# Patient Record
Sex: Male | Born: 1945 | Race: White | Hispanic: Yes | Marital: Married | Smoking: Never smoker
Health system: Southern US, Community
[De-identification: ages and names within clinical notes are randomized; demographics above are authoritative.]

## PROBLEM LIST (undated history)

## (undated) DIAGNOSIS — H409 Unspecified glaucoma: Secondary | ICD-10-CM

## (undated) HISTORY — PX: HERNIA REPAIR: SHX51

## (undated) HISTORY — DX: Unspecified glaucoma: H40.9

## (undated) HISTORY — PX: EYE SURGERY: SHX253

---

## 2016-06-16 ENCOUNTER — Telehealth: Payer: Self-pay | Admitting: Family Medicine

## 2016-06-16 NOTE — Telephone Encounter (Signed)
Note    Pt wife cadence haslam is needing to talk with dr sagardia regarding her husband and his loss or memory and change in behavior she does not want the husband to know that this is being discussed right now he has an appointment with Dr Terence Lux tomorrow at 1:30 please call wife before appointment she is very upset and worried about husband she says that he will be upset because he is coming to Doctor office but liked Dr Alvy Bimler when his wife came in to see him please respond please ask for his wife Zipporah Plants number [phone]

## 2016-06-17 ENCOUNTER — Ambulatory Visit (INDEPENDENT_AMBULATORY_CARE_PROVIDER_SITE_OTHER): Payer: Medicare Other | Admitting: Emergency Medicine

## 2016-06-17 VITALS — BP 151/79 | HR 54 | Temp 98.9°F | Resp 16 | Wt 153.8 lb

## 2016-06-17 DIAGNOSIS — Z8669 Personal history of other diseases of the nervous system and sense organs: Secondary | ICD-10-CM

## 2016-06-17 DIAGNOSIS — I10 Essential (primary) hypertension: Secondary | ICD-10-CM | POA: Diagnosis not present

## 2016-06-17 DIAGNOSIS — Z1211 Encounter for screening for malignant neoplasm of colon: Secondary | ICD-10-CM | POA: Diagnosis not present

## 2016-06-17 DIAGNOSIS — R413 Other amnesia: Secondary | ICD-10-CM | POA: Diagnosis not present

## 2016-06-17 DIAGNOSIS — H9192 Unspecified hearing loss, left ear: Secondary | ICD-10-CM

## 2016-06-17 DIAGNOSIS — E785 Hyperlipidemia, unspecified: Secondary | ICD-10-CM | POA: Diagnosis not present

## 2016-06-17 MED ORDER — HYDROCHLOROTHIAZIDE 25 MG PO TABS: 25.0000 mg | ORAL_TABLET | Freq: Every day | ORAL | 3 refills | Status: DC

## 2016-06-17 MED ORDER — PRAVASTATIN SODIUM 40 MG PO TABS: 40.0000 mg | ORAL_TABLET | Freq: Every day | ORAL | 3 refills | Status: AC

## 2016-06-17 MED ORDER — ATENOLOL 50 MG PO TABS: 50.0000 mg | ORAL_TABLET | Freq: Every day | ORAL | 3 refills | Status: AC

## 2016-06-17 NOTE — Patient Instructions (Addendum)
IF you received an x-ray today, you will receive an invoice from [facility] Radiology. Please contact [facility] Radiology at [phone] with questions or concerns regarding your invoice.   IF you received labwork today, you will receive an invoice from [facility]. Please contact [facility] at [phone] with questions or concerns regarding your invoice.   Our billing staff will not be able to assist you with questions regarding bills from these companies.  You will be contacted with the lab results as soon as they are available. The fastest way to get your results is to activate your My Chart account. Instructions are located on the last page of this paperwork. If you have not heard from [place] regarding the results in 2 weeks, please contact this office.      Hipertensin Hypertension El trmino hipertensin es otra forma de denominar a la presin arterial elevada. La presin arterial elevada fuerza al corazn a trabajar ms para bombear la sangre. Esto puede causar problemas con el paso del [place]. Una lectura de presin arterial est compuesta por 2 nmeros. Hay un nmero superior (sistlico) sobre un nmero inferior (diastlico). Lo ideal es tener la presin arterial por debajo de 120/80. Las decisiones saludables pueden ayudarle a disminuir su presin arterial. Es posible que necesite medicamentos que le ayuden a disminuir su presin arterial si:  Su presin arterial no disminuye mediante decisiones saludables.  Su presin arterial est por encima de 130/80. Siga estas instrucciones en su casa: Comida y bebida   Si se lo indican, siga el plan de alimentacin de DASH (Dietary Approaches to Stop Hypertension, Maneras de alimentarse para detener la hipertensin). Esta dieta incluye:  Que la mitad del plato de cada comida sea de frutas y verduras.  Que un cuarto del plato de cada comida sea de cereales integrales. Los cereales integrales incluyen pasta integral, arroz integral y pan  integral.  Comer y beber productos lcteos con bajo contenido de [place], como leche descremada o yogur bajo en grasas.  Que un cuarto del plato de cada comida sea de protenas bajas en grasa [place]). Las protenas bajas en grasa incluyen pescado, pollo sin piel, huevos, frijoles y tofu.  Evitar consumir carne grasa, carne curada y procesada, o pollo con piel.  Evitar consumir alimentos prehechos o procesados.  Consuma menos de 1500 mg de sal (sodio) por da.  Limite el consumo de alcohol a no ms de 1 medida por da si es mujer y no est Orthoptist y a 2 medidas por da si es hombre. Una medida equivale a 12onzas de cerveza, 5onzas de vino o 1onzas de bebidas alcohlicas de alta graduacin. Estilo de vida   Trabaje con su mdico para mantenerse en un peso saludable o para perder peso. Pregntele a su mdico cul es el peso recomendable para usted.  Realice al menos 30 minutos de ejercicio que haga que se acelere su corazn (ejercicio Magazine features editor) la [employer] de la [place]. Estos pueden incluir caminar, nadar o andar en bicicleta.  Realice al menos 30 minutos de ejercicio que fortalezca sus msculos (ejercicios de resistencia) al menos 3 das a la [place]. Estos pueden incluir levantar pesas o hacer pilates.  No consuma ningn producto que contenga nicotina o tabaco. Esto incluye cigarrillos y cigarrillos electrnicos. Si necesita ayuda para dejar de fumar, consulte al mdico.  Controle su presin arterial en su casa tal como le indic el mdico.  Concurra a todas las visitas de control como se lo haya indicado el mdico.  Esto es importante. Medicamentos   [employer] de venta libre y los recetados solamente como se lo haya indicado el mdico. Siga cuidadosamente las indicaciones.  No omita las dosis de medicamentos para la presin arterial. Los medicamentos pierden eficacia si omite dosis. El hecho de omitir las dosis tambin [place] el riesgo de otros  problemas.  Pregntele a su mdico a qu efectos secundarios o reacciones a los Museum/gallery curator. Comunquese con un mdico si:  Piensa que tiene [place] reaccin a los medicamentos que est tomando.  Tiene dolores de cabeza frecuentes (recurrentes).  Siente mareos.  Tiene hinchazn en los tobillos.  Tiene problemas de visin. Solicite ayuda de inmediato si:  Siente un dolor de cabeza muy intenso.  Comienza a sentirse confundido.  Se siente dbil o adormecido.  Siente que va a desmayarse.  Siente un dolor muy intenso en:  El pecho.  El vientre (abdomen).  Devuelve (vomita) ms de una vez.  Tiene dificultad para respirar. Resumen  El trmino hipertensin es otra forma de denominar a la presin arterial elevada.  Las decisiones saludables pueden ayudarle a disminuir su presin arterial. Si no puede controlar su presin arterial mediante decisiones saludables, es posible que deba tomar medicamentos. Esta informacin no tiene Theme park manager el consejo del mdico. Asegrese de hacerle al mdico cualquier pregunta que tenga. Document Released: 08/05/2009 Document Revised: 01/28/2016 Document Reviewed: 01/28/2016 Elsevier Interactive Patient Education  2017 [employer].

## 2016-06-17 NOTE — Progress Notes (Signed)
Mark Jensen 71 y.o.   Chief Complaint  Patient presents with  . Establish Care    Memory issues     HISTORY OF PRESENT ILLNESS: This is a 71 y.o. 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  HPI   Prior to Admission medications   Medication Sig Start Date End Date Taking? Authorizing Provider  atenolol (TENORMIN) 50 MG tablet Take 50 mg by mouth daily.   Yes Historical Provider, MD  hydrALAZINE (APRESOLINE) 25 MG tablet Take 25 mg by mouth once.   Yes Historical Provider, MD  pravastatin (PRAVACHOL) 40 MG tablet Take 40 mg by mouth daily.   Yes Historical Provider, MD    Allergies  Allergen Reactions  . Aspirin   . Penicillins     There are no active problems to display for this patient.   Past Medical History:  Diagnosis Date  . Glaucoma     Past Surgical History:  Procedure Laterality Date  . EYE SURGERY    . HERNIA REPAIR      Social History   Social History  . Marital status: Married    Spouse name: N/A  . Number of children: N/A  . Years of education: N/A   Occupational History  . Not on file.   Social History Main Topics  . Smoking status: Never Smoker  . Smokeless tobacco: Never Used  . Alcohol use No  . Drug use: No  . Sexual activity: Not on file   Other Topics Concern  . Not on file   Social History Narrative  . No narrative on file    Family History  Problem Relation Age of Onset  . Hypertension Father   . Hyperlipidemia Father      Review of Systems  Constitutional: Negative.  Negative for chills, fever, malaise/fatigue and weight loss.  HENT: Positive for hearing loss (left ear). Negative for congestion, ear pain, nosebleeds, sinus pain and sore throat.   Eyes: Negative.  Negative for blurred vision, double vision and pain.  Respiratory: Negative.  Negative for cough, hemoptysis, shortness of breath and wheezing.     Cardiovascular: Negative.  Negative for chest pain, palpitations, claudication and leg swelling.  Gastrointestinal: Negative.  Negative for abdominal pain, blood in stool, constipation, diarrhea, melena, nausea and vomiting.  Genitourinary: Negative.  Negative for dysuria, frequency, hematuria and urgency.  Musculoskeletal: Negative.  Negative for back pain, myalgias and neck pain.  Skin: Negative.  Negative for rash.  Neurological: Negative.  Negative for dizziness, tingling, tremors, sensory change, speech change, focal weakness, seizures, loss of consciousness, weakness and headaches.  Endo/Heme/Allergies: Negative.   Psychiatric/Behavioral: Positive for memory loss. Negative for depression, hallucinations, substance abuse and suicidal ideas. The patient is not nervous/anxious and does not have insomnia.   All other systems reviewed and are negative.    Vitals:   06/17/16 1401  BP: (!) 151/79  Pulse: (!) 54  Resp: 16  Temp: 98.9 F (37.2 C)     Physical Exam  Constitutional: He is oriented to person, place, and time. He appears well-developed and well-nourished.  HENT:  Head: Normocephalic and atraumatic.  Nose: Nose normal.  Mouth/Throat: Oropharynx is clear and moist. No oropharyngeal exudate.  Eyes: Conjunctivae and EOM are normal. Pupils are equal, round, and reactive to light.  Neck: Normal range of motion. Neck supple. No JVD present. No thyromegaly present.  Cardiovascular: Normal rate, regular  rhythm, normal heart sounds and intact distal pulses.   Pulmonary/Chest: Effort normal and breath sounds normal.  Abdominal: Soft. Bowel sounds are normal. He exhibits no distension. There is no tenderness.  Musculoskeletal: Normal range of motion.  Lymphadenopathy:    He has no cervical adenopathy.  Neurological: He is alert and oriented to person, place, and time. He displays normal reflexes. No cranial nerve deficit or sensory deficit. He exhibits normal muscle tone.  Coordination normal.  Skin: Skin is warm and dry. Capillary refill takes less than 2 seconds. No rash noted.  Psychiatric: He has a normal mood and affect. His behavior is normal. Thought content normal.  Vitals reviewed.    ASSESSMENT & PLAN: Mark was seen today for establish care.  Diagnoses and all orders for this visit:  Memory difficulties -     CBC with Differential/Platelet -     Hemoglobin A1c -     Comprehensive metabolic panel -     Lipid panel -     TSH -     CT Head Wo Contrast; Future  Essential hypertension -     atenolol (TENORMIN) 50 MG tablet; Take 1 tablet (50 mg total) by mouth daily. -     hydrochlorothiazide (HYDRODIURIL) 25 MG tablet; Take 1 tablet (25 mg total) by mouth daily.  History of glaucoma  Dyslipidemia -     pravastatin (PRAVACHOL) 40 MG tablet; Take 1 tablet (40 mg total) by mouth daily.  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 Please contact [facility] Radiology at [phone] with questions or concerns regarding your invoice.   IF you received labwork today, you will receive an invoice from [facility]. Please contact [facility] at [phone] with questions or concerns regarding your invoice.   Our billing staff will not be able to assist you with questions regarding bills from these companies.  You will be contacted with the lab results as soon as they are available. The fastest way to get your results is to activate your My Chart account. Instructions are located on the last page of this paperwork. If you have not heard from [place] regarding the results in 2 weeks, please contact this office.      Hipertensin Hypertension El trmino hipertensin es otra forma de denominar a la presin arterial elevada. La presin arterial elevada fuerza al corazn a  trabajar ms para bombear la sangre. Esto puede causar problemas con el paso del [place]. Una lectura de presin arterial est compuesta por 2 nmeros. Hay un nmero superior (sistlico) sobre un nmero inferior (diastlico). Lo ideal es tener la presin arterial por debajo de 120/80. Las decisiones saludables pueden ayudarle a disminuir su presin arterial. Es posible que necesite medicamentos que le ayuden a disminuir su presin arterial si:  Su presin arterial no disminuye mediante decisiones saludables.  Su presin arterial est por encima de 130/80. Siga estas instrucciones en su casa: Comida y bebida   Si se lo indican, siga el plan de alimentacin de DASH (Dietary Approaches to Stop Hypertension, Maneras de alimentarse para detener la hipertensin). Esta dieta incluye:  Que la mitad del plato de cada comida sea de frutas y verduras.  Que un cuarto del plato de cada comida sea de cereales integrales. Los cereales integrales incluyen pasta integral, arroz integral  y pan integral.  Comer y beber productos lcteos con bajo contenido de [place], como leche descremada o yogur bajo en grasas.  Que un cuarto del plato de cada comida sea de protenas bajas en grasa [place]). Las protenas bajas en grasa incluyen pescado, pollo sin piel, huevos, frijoles y tofu.  Evitar consumir carne grasa, carne curada y procesada, o pollo con piel.  Evitar consumir alimentos prehechos o procesados.  Consuma menos de 1500 mg de sal (sodio) por da.  Limite el consumo de alcohol a no ms de 1 medida por da si es mujer y no est Orthoptist y a 2 medidas por da si es hombre. Una medida equivale a 12onzas de cerveza, 5onzas de vino o 1onzas de bebidas alcohlicas de alta graduacin. Estilo de vida   Trabaje con su mdico para mantenerse en un peso saludable o para perder peso. Pregntele a su mdico cul es el peso recomendable para usted.  Realice al menos 30 minutos de ejercicio que haga que se acelere  su corazn (ejercicio Magazine features editor) la [employer] de la [place]. Estos pueden incluir caminar, nadar o andar en bicicleta.  Realice al menos 30 minutos de ejercicio que fortalezca sus msculos (ejercicios de resistencia) al menos 3 das a la [place]. Estos pueden incluir levantar pesas o hacer pilates.  No consuma ningn producto que contenga nicotina o tabaco. Esto incluye cigarrillos y cigarrillos electrnicos. Si necesita ayuda para dejar de fumar, consulte al [employer].  Controle su presin arterial en su casa tal como le indic el mdico.  Concurra a todas las visitas de control como se lo haya indicado el mdico. Esto es importante. Medicamentos   [employer] de venta libre y los recetados solamente como se lo haya indicado el mdico. Siga cuidadosamente las indicaciones.  No omita las dosis de medicamentos para la presin arterial. Los medicamentos pierden eficacia si omite dosis. El hecho de omitir las dosis tambin [place] el riesgo de otros problemas.  Pregntele a su mdico a qu efectos secundarios o reacciones a los Museum/gallery curator. Comunquese con un mdico si:  Piensa que tiene [place] reaccin a los medicamentos que est tomando.  Tiene dolores de cabeza frecuentes (recurrentes).  Siente mareos.  Tiene hinchazn en los tobillos.  Tiene problemas de visin. Solicite ayuda de inmediato si:  Siente un dolor de cabeza muy intenso.  Comienza a sentirse confundido.  Se siente dbil o adormecido.  Siente que va a desmayarse.  Siente un dolor muy intenso en:  El pecho.  El vientre (abdomen).  Devuelve (vomita) ms de una vez.  Tiene dificultad para respirar. Resumen  El trmino hipertensin es otra forma de denominar a la presin arterial elevada.  Las decisiones saludables pueden ayudarle a disminuir su presin arterial. Si no puede controlar su presin arterial mediante decisiones saludables, es posible que deba tomar  medicamentos. Esta informacin no tiene Theme park manager el consejo del mdico. Asegrese de hacerle al mdico cualquier pregunta que tenga. Document Released: 08/05/2009 Document Revised: 01/28/2016 Document Reviewed: 01/28/2016 Elsevier Interactive Patient Education  2017 Elsevier Inc.      Edwina Barth, MD [facility]

## 2016-06-18 ENCOUNTER — Telehealth: Payer: Self-pay | Admitting: Emergency Medicine

## 2016-06-18 DIAGNOSIS — I10 Essential (primary) hypertension: Secondary | ICD-10-CM

## 2016-06-18 LAB — CBC WITH DIFFERENTIAL/PLATELET
BASOS: 1 %
Basophils Absolute: 0 10*3/uL (ref 0.0–0.2)
EOS (ABSOLUTE): 0.1 10*3/uL (ref 0.0–0.4)
EOS: 1 %
HEMATOCRIT: 40.7 % (ref 37.5–51.0)
Hemoglobin: 13.9 g/dL (ref 13.0–17.7)
IMMATURE GRANS (ABS): 0 10*3/uL (ref 0.0–0.1)
IMMATURE GRANULOCYTES: 0 %
LYMPHS: 24 %
Lymphocytes Absolute: 1.6 10*3/uL (ref 0.7–3.1)
MCH: 30.1 pg (ref 26.6–33.0)
MCHC: 34.2 g/dL (ref 31.5–35.7)
MCV: 88 fL (ref 79–97)
MONOCYTES: 7 %
Monocytes Absolute: 0.5 10*3/uL (ref 0.1–0.9)
NEUTROS PCT: 67 %
Neutrophils Absolute: 4.6 10*3/uL (ref 1.4–7.0)
Platelets: 218 10*3/uL (ref 150–379)
RBC: 4.62 x10E6/uL (ref 4.14–5.80)
RDW: 14.5 % (ref 12.3–15.4)
WBC: 6.8 10*3/uL (ref 3.4–10.8)

## 2016-06-18 LAB — COMPREHENSIVE METABOLIC PANEL
ALBUMIN: 4.2 g/dL (ref 3.5–4.8)
ALK PHOS: 58 IU/L (ref 39–117)
ALT: 15 IU/L (ref 0–44)
AST: 18 IU/L (ref 0–40)
Albumin/Globulin Ratio: 1.7 (ref 1.2–2.2)
BUN / CREAT RATIO: 18 (ref 10–24)
BUN: 20 mg/dL (ref 8–27)
Bilirubin Total: 0.8 mg/dL (ref 0.0–1.2)
CHLORIDE: 100 mmol/L (ref 96–106)
CO2: 25 mmol/L (ref 18–29)
Calcium: 9.4 mg/dL (ref 8.6–10.2)
Creatinine, Ser: 1.13 mg/dL (ref 0.76–1.27)
GFR calc non Af Amer: 65 mL/min/{1.73_m2} (ref >59)
GFR, EST AFRICAN AMERICAN: 75 mL/min/{1.73_m2} (ref >59)
GLUCOSE: 91 mg/dL (ref 65–99)
Globulin, Total: 2.5 g/dL (ref 1.5–4.5)
Potassium: 4 mmol/L (ref 3.5–5.2)
Sodium: 138 mmol/L (ref 134–144)
TOTAL PROTEIN: 6.7 g/dL (ref 6.0–8.5)

## 2016-06-18 LAB — HEMOGLOBIN A1C
Est. average glucose Bld gHb Est-mCnc: 111 mg/dL
Hgb A1c MFr Bld: 5.5 % (ref 4.8–5.6)

## 2016-06-18 LAB — LIPID PANEL
CHOL/HDL RATIO: 3.4 ratio (ref 0.0–5.0)
Cholesterol, Total: 222 mg/dL — ABNORMAL HIGH (ref 100–199)
HDL: 66 mg/dL (ref >39)
LDL Calculated: 122 mg/dL — ABNORMAL HIGH (ref 0–99)
TRIGLYCERIDES: 172 mg/dL — AB (ref 0–149)
VLDL Cholesterol Cal: 34 mg/dL (ref 5–40)

## 2016-06-18 LAB — TSH: TSH: 1.82 u[IU]/mL (ref 0.450–4.500)

## 2016-06-18 MED ORDER — HYDROCHLOROTHIAZIDE 25 MG PO TABS: 25.0000 mg | ORAL_TABLET | Freq: Every day | ORAL | 3 refills | Status: AC

## 2016-06-18 NOTE — Telephone Encounter (Signed)
Changed and sent

## 2016-06-18 NOTE — Telephone Encounter (Signed)
Pts wife called in stating that her husband is supposed to be taking the brand name hydrodiuril, not the generic hydrochlorothiazide.  Please advise [phone]

## 2016-06-18 NOTE — Telephone Encounter (Signed)
Spoke to her yesterday.

## 2016-06-21 ENCOUNTER — Encounter: Payer: Self-pay | Admitting: Internal Medicine

## 2016-06-30 ENCOUNTER — Telehealth: Payer: Self-pay | Admitting: Family Medicine

## 2016-06-30 NOTE — Telephone Encounter (Signed)
PT WIFE CALLING ABOUT REFERRAL ON CT HEAD PLEASE RESPOND

## 2016-07-01 NOTE — Telephone Encounter (Signed)
Called pt and pt's wife answered. Gave wife the phone number to schedule the CT [facility]. Wife is on Hippa.

## 2016-07-01 NOTE — Telephone Encounter (Signed)
06/17/16 order for ct scan , please follow up

## 2016-07-01 NOTE — Telephone Encounter (Signed)
OK 

## 2016-07-01 NOTE — Telephone Encounter (Signed)
Concerns from daughter in [place]  Wonders about dementia, anxiety, depression, aggression towards wife at night and first thing in morning.  Daughter advised wife to give benadryl at night to sleep currently.  Wanders away from home and does get lost.  Daughter is not on hippa, I advised I would pass info onto MD and follow up with Parents.

## 2016-07-06 ENCOUNTER — Ambulatory Visit (HOSPITAL_COMMUNITY)
Admission: RE | Admit: 2016-07-06 | Discharge: 2016-07-06 | Disposition: A | Discharge status: Home / Self Care | Payer: Medicare Other | Source: Ambulatory Visit | Attending: Emergency Medicine | Admitting: Emergency Medicine

## 2016-07-06 DIAGNOSIS — R413 Other amnesia: Secondary | ICD-10-CM | POA: Diagnosis present

## 2016-07-06 DIAGNOSIS — Z9620 Presence of otological and audiological implant, unspecified: Secondary | ICD-10-CM | POA: Diagnosis not present

## 2016-07-06 DIAGNOSIS — J322 Chronic ethmoidal sinusitis: Secondary | ICD-10-CM | POA: Diagnosis not present

## 2016-07-06 DIAGNOSIS — H9190 Unspecified hearing loss, unspecified ear: Secondary | ICD-10-CM | POA: Diagnosis present

## 2016-07-06 DIAGNOSIS — Z9889 Other specified postprocedural states: Secondary | ICD-10-CM | POA: Diagnosis not present

## 2016-07-07 ENCOUNTER — Encounter: Payer: Self-pay | Admitting: Radiology

## 2016-07-13 ENCOUNTER — Telehealth: Payer: Self-pay | Admitting: Emergency Medicine

## 2016-07-13 DIAGNOSIS — F09 Unspecified mental disorder due to known physiological condition: Secondary | ICD-10-CM

## 2016-07-13 NOTE — Telephone Encounter (Signed)
Pt wife is calling about CT scan results   Best number [phone]

## 2016-07-15 ENCOUNTER — Other Ambulatory Visit: Payer: Self-pay | Admitting: Emergency Medicine

## 2016-07-15 NOTE — Telephone Encounter (Signed)
Please sign referral

## 2016-07-15 NOTE — Telephone Encounter (Signed)
Done. Thank you, Clydie BraunKaren.

## 2016-07-15 NOTE — Telephone Encounter (Signed)
[facility] on 5/17

## 2016-07-15 NOTE — Telephone Encounter (Signed)
Wife is worried about stress and always repeating himself

## 2016-07-15 NOTE — Telephone Encounter (Signed)
Will need Neuro consult.

## 2016-07-15 NOTE — Telephone Encounter (Signed)
Advised of nl ct scan

## 2016-07-19 ENCOUNTER — Telehealth: Payer: Self-pay | Admitting: Emergency Medicine

## 2016-07-19 NOTE — Telephone Encounter (Signed)
Called [facility] to let them know pt will schedule with them. They said to call pt and tell him to schedule at his convenience. Tried calling pt to let him know this and could not leave a vm.

## 2016-07-19 NOTE — Telephone Encounter (Signed)
Pt advised and given [facility] number

## 2016-07-20 ENCOUNTER — Encounter: Payer: Self-pay | Admitting: Neurology

## 2016-07-20 ENCOUNTER — Telehealth: Payer: Self-pay | Admitting: Emergency Medicine

## 2016-07-20 NOTE — Telephone Encounter (Signed)
We are working on getting this scheduled now, see prior notes . Can we help get scheduled?

## 2016-07-20 NOTE — Telephone Encounter (Signed)
Scheduled at [facility] for 7/18 at 10:15 am. This was their soonest. Pt's wife advised.

## 2016-07-20 NOTE — Telephone Encounter (Signed)
Pt wife is calling stating that she needs to talk with someone about her husband being sent to a neurologist   She states that husband is not home and she can talk because he is not wanting to go but the something is wrong and she needs help getting him to go   Best number [phone]

## 2016-07-28 ENCOUNTER — Telehealth: Payer: Self-pay | Admitting: Emergency Medicine

## 2016-07-28 NOTE — Telephone Encounter (Signed)
Pt is calling to let us know that pt sent a letter to dr sagardia that he would not be going to his neurology appt but she states please keep the appt do not cancel it   Best number [phone]

## 2016-07-29 ENCOUNTER — Encounter: Payer: Self-pay | Admitting: Internal Medicine

## 2016-08-09 ENCOUNTER — Encounter: Payer: Self-pay | Admitting: Neurology

## 2016-08-09 ENCOUNTER — Ambulatory Visit (INDEPENDENT_AMBULATORY_CARE_PROVIDER_SITE_OTHER): Payer: Medicare Other | Admitting: Neurology

## 2016-08-09 VITALS — BP 154/74 | HR 67 | Ht 66.0 in | Wt 157.0 lb

## 2016-08-09 DIAGNOSIS — F4323 Adjustment disorder with mixed anxiety and depressed mood: Secondary | ICD-10-CM | POA: Diagnosis not present

## 2016-08-09 DIAGNOSIS — G3184 Mild cognitive impairment, so stated: Secondary | ICD-10-CM | POA: Diagnosis not present

## 2016-08-09 NOTE — Progress Notes (Signed)
NEUROLOGY CONSULTATION NOTE  Mark Medearis MRN: [id number] DOB: 09-Feb-1946  Referring provider: Dr. Georgina Quint Primary care provider: Dr. Eilleen Kempf Sagardia  Reason for consult:  Memory loss  Dear Dr Alvy Bimler:  Thank you for your kind referral of Mark Jensen for consultation of the above symptoms. Although his history is well known to you, please allow me to reiterate it for the purpose of our medical record. The patient was accompanied to the clinic by his wife who also provides collateral information. Records and images were personally reviewed where available.  HISTORY OF PRESENT ILLNESS: This is a pleasant 71 year old right-handed man with a history of hypertension, hyperlipidemia, presenting for evaluation of memory loss. Per records, his wife called the PCP office concerned about dementia, anxiety, depression, aggression towards wife at night and first thing in the morning. She is reticent with speaking in front of him, but several times during the visit makes faces and nods when he is asked questions that he denies any problems with. He feels his memory is good, he sometimes forgets things, but denies getting lost driving or missing his medications. His wife has always been in charge of bill payments. His wife is hesitant to say how long memory changes have been going on, and agrees with him when he says about a year. She says he would ask the same questions and occasionally misplace things at home. There is note that he wanders away from home and gets lost, but neither of them admit to this today. His wife says she has no driving concerns. He reports his mood is "alright." He denies any irritability, but his wife shakes her head behind him. He admits "maybe there is anxiety, because I am not living in a place where I should be living." He states she really does not like living in [place], "people do not walk on the street." He does not like this kind of environment,  stating it is very different from the environment in [place] and [place]. When asked about their living situation, it appears complicated, but his wife could not speak freely about it. The had been living in [place] with his sister-in-law, who then moved to [facility], so they bought a house in [place] which they lived in for 3 years. They decided to move to [place] to be closer to their daughter and grandchild. His wife hints that it appears to have been his fault, but after a year, things fell through and they had to move in again with her sister here in [place] 9 months ago.   He has occasional upper back pain. He denies any headaches, dizziness, diplopia, dysarthria/dysphagia, neck pain, focal numbness/tingling/weakness, bowel/bladder dysfunction, anosmia, or tremors. There is note that his daughter advised wife to give benadryl at night to sleep, but he reports today that sleep is good. He denies any family history of dementia, but his wife later on spoke to staff separately and reported his father had memory issues and his mother died of a stroke. 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 A couple of years ago, he started hearing a buzz in his left ear, and states he will be going back to his ear doctor in [place] who had previously taken care of it. 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   I personally reviewed head CT without contrast done 07/06/16 which did not show any acute intracranial changes. There was mild diffuse atrophy and chronic microvascular disease. There was note of left stapedectomy, prosthesis may extend slightly into the vestibule.  Laboratory Data: Lab Results  Component Value Date   TSH 1.820 06/17/2016     PAST MEDICAL HISTORY: Past Medical History:  Diagnosis Date  . Glaucoma     PAST  SURGICAL HISTORY: Past Surgical History:  Procedure Laterality Date  . EYE SURGERY    . HERNIA REPAIR      MEDICATIONS: Current Outpatient Prescriptions on File Prior to Visit  Medication Sig Dispense Refill  . atenolol (TENORMIN) 50 MG tablet Take 1 tablet (50 mg total) by mouth daily. 90 tablet 3  . hydrochlorothiazide (HYDRODIURIL) 25 MG tablet Take 1 tablet (25 mg total) by mouth daily. Brand only 90 tablet 3  . pravastatin (PRAVACHOL) 40 MG tablet Take 1 tablet (40 mg total) by mouth daily. 90 tablet 3   No current facility-administered medications on file prior to visit.     ALLERGIES: Allergies  Allergen Reactions  . Aspirin   . Penicillins     FAMILY HISTORY: Family History  Problem Relation Age of Onset  . Hypertension Father   . Hyperlipidemia Father     SOCIAL HISTORY: Social History   Social History  . Marital status: Married    Spouse name: N/A  . Number of children: N/A  . Years of education: N/A   Occupational History  . Not on file.   Social History Main Topics  . Smoking status: Never Smoker  . Smokeless tobacco: Never Used  . Alcohol use No  . Drug use: No  . Sexual activity: Not on file   Other Topics Concern  . Not on file   Social History Narrative  . No narrative on file    REVIEW OF SYSTEMS: Constitutional: No fevers, chills, or sweats, no generalized fatigue, change in appetite Eyes: No visual changes, double vision, eye pain Ear, nose and throat: No hearing loss, ear pain, nasal congestion, sore throat Cardiovascular: No chest pain, palpitations Respiratory:  No shortness of breath at rest or with exertion, wheezes GastrointestinaI: No nausea, vomiting, diarrhea, abdominal pain, fecal incontinence Genitourinary:  No dysuria, urinary retention or frequency Musculoskeletal:  No neck pain, + occasional back pain Integumentary: No rash, pruritus, skin lesions Neurological: as above Psychiatric: No depression, insomnia,  anxiety Endocrine: No palpitations, fatigue, diaphoresis, mood swings, change in appetite, change in weight, increased thirst Hematologic/Lymphatic:  No anemia, purpura, petechiae. Allergic/Immunologic: no itchy/runny eyes, nasal congestion, recent allergic reactions, rashes  PHYSICAL EXAM: Vitals:   08/09/16 0854  BP: (!) 154/74  Pulse: 67   General: No acute distress Head:  Normocephalic/atraumatic Eyes: Fundoscopic exam shows bilateral sharp discs, no vessel changes, exudates, or hemorrhages Neck: supple, no paraspinal tenderness, full range of motion Back: No paraspinal tenderness Heart: regular rate and rhythm Lungs: Clear to auscultation bilaterally. Vascular: No carotid bruits. Skin/Extremities: No rash, no edema Neurological Exam: Mental status: alert and oriented to person, place, and month, no dysarthria or aphasia, Fund of knowledge is appropriate.  Remote memory intact.  Attention and concentration are normal.    Able to name objects and repeat phrases.  Montreal Cognitive Assessment  08/09/2016  Visuospatial/ Executive (0/5) 5  Naming (0/3) 2  Attention: Read list of digits (0/2) 2  Attention: Read list of letters (0/1) 1  Attention:  Serial 7 subtraction starting at 100 (0/3) 2  Language: Repeat phrase (0/2) 2  Language : Fluency (0/1) 1  Abstraction (0/2) 2  Delayed Recall (0/5) 0  Orientation (0/6) 4  Total 21   Cranial nerves: CN I: not tested CN II: pupils equal, round and reactive to light, visual fields intact, fundi unremarkable. CN III, IV, VI:  full range of motion, no nystagmus, no ptosis CN V: facial sensation intact CN VII: upper and lower face symmetric CN VIII: hearing intact to finger rub CN IX, X: gag intact, uvula midline CN XI: sternocleidomastoid and trapezius muscles intact CN XII: tongue midline Bulk & Tone: normal, no cogwheeling, no fasciculations. Motor: 5/5 throughout with no pronator drift. Sensation: intact to light touch, cold,  pin, vibration and joint position sense.  No extinction to double simultaneous stimulation.  Romberg test negative Deep Tendon Reflexes: +2 throughout, no ankle clonus Plantar responses: downgoing bilaterally Cerebellar: no incoordination on finger to nose, heel to shin. No dysdiadochokinesia Gait: narrow-based and steady, able to tandem walk adequately. Tremor: none  IMPRESSION: This is a pleasant 71 year old right-handed man with a history of hypertension, hyperlipidemia, presenting for evaluation of worsening memory. From PCP phone calls from his wife, it appears there are also a lot of mood changes with depression, anxiety, and agitation. His wife is very reticent to share anything in front of him today. His MOCA score is 21/30, indicating mild cognitive impairment, possible mild dementia, but difficult to obtain from history given in the office today. There is however a lot of stress with their living situation, which is likely a bigger contributing factor to his mood issues. We discussed medications for mood, he states he had more success with homeopathic medicines, I discussed with him that he can speak to his PCP or homeopathic provider of choice, but would strongly recommend starting treatment for mood. He agrees to a referral for psychotherapy, I discussed with them that maybe couples therapy may help as well. We discussed medications for memory, how these medications may potentially slow down worsening of memory, but is not a cure. At this point, helping with mood is likely more beneficial between starting mood medication versus cholinesterase inhibitors. He states he does not plan to stay here and plans to go back to [place]. His wife shakes her head, this appears to be a constant point of contention. He will follow-up in 6 months and knows to call for any changes.   Thank you for allowing me to participate in the care of this patient. Please do not hesitate to call for any questions or  concerns.   Patrcia DollyKaren Aquino, M.D.  CC: Dr. Alvy BimlerSagardia

## 2016-08-09 NOTE — Patient Instructions (Addendum)
1. Refer to Behavioral Medicine for therapy 2. Recommend discussing starting mood medicine with either Dr. Alvy BimlerSagardia or your provider of choice to help with situational anxiety and depression 3. Follow-up in 6 months, call for any changes

## 2016-08-13 ENCOUNTER — Telehealth: Payer: Self-pay | Admitting: Emergency Medicine

## 2016-08-13 NOTE — Telephone Encounter (Signed)
Pt's wife is calling to see if we got the progress notes from pt's neurologist.  Please advise if you see them [phone]

## 2016-08-20 ENCOUNTER — Telehealth: Payer: Self-pay | Admitting: Neurology

## 2016-08-20 NOTE — Telephone Encounter (Signed)
Pt wife called and would like someone to call her to let her know what is going on with her husband.  She couldn't make the appt, but needs to know.

## 2016-08-23 ENCOUNTER — Telehealth: Payer: Self-pay | Admitting: Emergency Medicine

## 2016-08-23 MED ORDER — DULOXETINE HCL 30 MG PO CPEP: 30.0000 mg | ORAL_CAPSULE | Freq: Every day | ORAL | 1 refills | Status: AC

## 2016-08-23 NOTE — Telephone Encounter (Signed)
Spoke to Mr. & Mrs. Mark Jensen about his condition. Neurologist's (Dr. Karel JarvisAquino) recommendation shared with them. His condition seems to be more of a situational reaction to his present condition. He's not happy with his recent move to the state and [place]. Discussed starting SSRI and he's willing to try it.

## 2016-08-23 NOTE — Telephone Encounter (Signed)
Please advise. I see they declined Neurology referral back in May

## 2016-08-23 NOTE — Telephone Encounter (Signed)
Returned pt wife call.  She seems to be confused about which doctor her husbands sees for what.  She stated that she only had a little time to talk before the pt came back.  I let her know that [facility].  She asked why we haven't called Dr. Latrelle DodrillSagardia's office and talked to him. I told her that our office noted were sent to Dr. Alvy BimlerSagardia while they were here in the office.  She stated that the pt is now wanting to leave the state and she is worried about his mental state.  She was quick to get off the phone stating "thank you, good bye" and hanging up while I was talking.

## 2016-08-23 NOTE — Telephone Encounter (Signed)
They were here last week and no mention of any of this. He was already seen by Neurologist and recommendations made. I will call them and handle this.

## 2016-08-23 NOTE — Telephone Encounter (Signed)
Pts wife is calling to see if Mark Jensen has heard anything from the neurologist about pts condition.  She is very scared about the situation that is going on with her husband and is at a loss of what to do.  She states that there are a lot of decisions to be made and she does not know what to do. Please advise [phone]

## 2016-08-25 ENCOUNTER — Encounter: Payer: Self-pay | Admitting: Internal Medicine

## 2016-09-06 ENCOUNTER — Ambulatory Visit: Payer: Medicare Other | Admitting: Neurology

## 2016-09-15 ENCOUNTER — Ambulatory Visit: Payer: Medicare Other | Admitting: Neurology

## 2017-02-10 ENCOUNTER — Ambulatory Visit: Payer: Medicare Other | Admitting: Neurology

## 2017-07-07 ENCOUNTER — Other Ambulatory Visit: Payer: Self-pay | Admitting: Emergency Medicine

## 2017-07-07 DIAGNOSIS — I10 Essential (primary) hypertension: Secondary | ICD-10-CM

## 2017-07-08 ENCOUNTER — Other Ambulatory Visit: Payer: Self-pay | Admitting: Emergency Medicine

## 2017-07-08 DIAGNOSIS — I10 Essential (primary) hypertension: Secondary | ICD-10-CM

## 2017-07-08 NOTE — Telephone Encounter (Signed)
Patient called and spoke to wife who says they live in [place] now and are seeing another doctor in [place]. I advised her to ask the pharmacy to send the request to that physician for medication refills, she verbalized understanding.

## 2017-07-08 NOTE — Telephone Encounter (Signed)
See previous msg.

## 2018-07-14 IMAGING — CT CT HEAD W/O CM
3 series · 14 of 47 positions shown, 16 images · non-contrast
Comparison: None.

CLINICAL DATA: 70-year-old male hearing noises within left ear for

EXAM:
CT HEAD WITHOUT CONTRAST
TECHNIQUE: Contiguous axial images were obtained from the base of the skull
through the vertex without intravenous contrast.

[Series 3: head 5.0 h30s · axial · 0.48mm/px · z∈[-93,+32]mm · 8 of 31 slices shown, 10 images]
[im 3/31  brain]
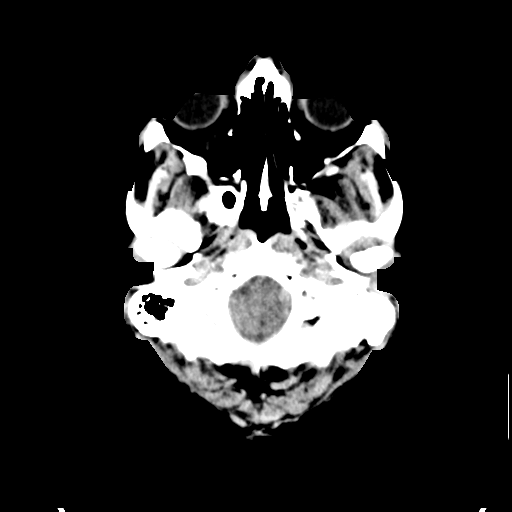
[im 3/31  bone]
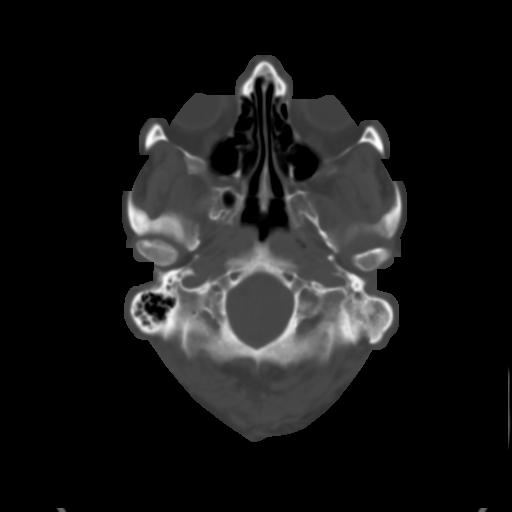
[im 7/31  brain]
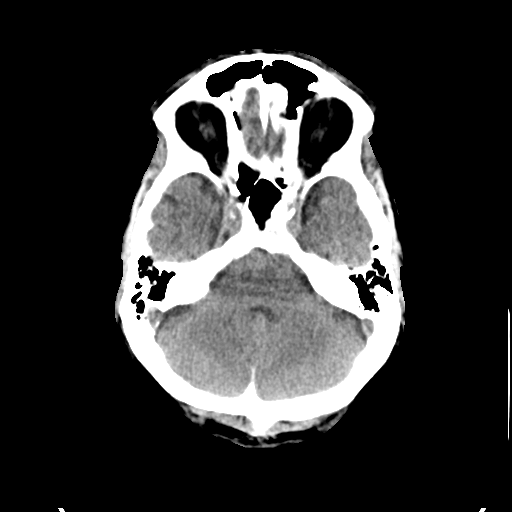
[im 10/31  brain]
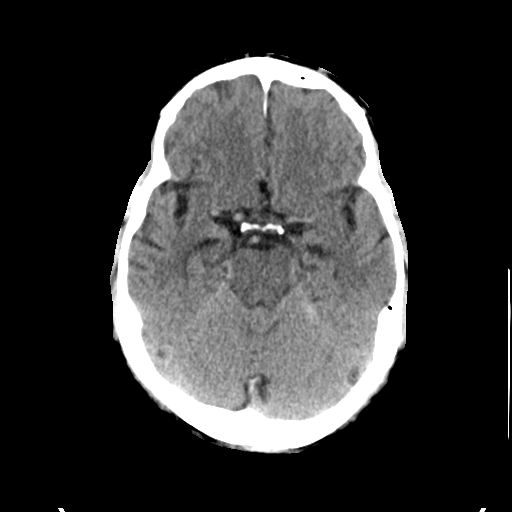
[im 14/31  brain]
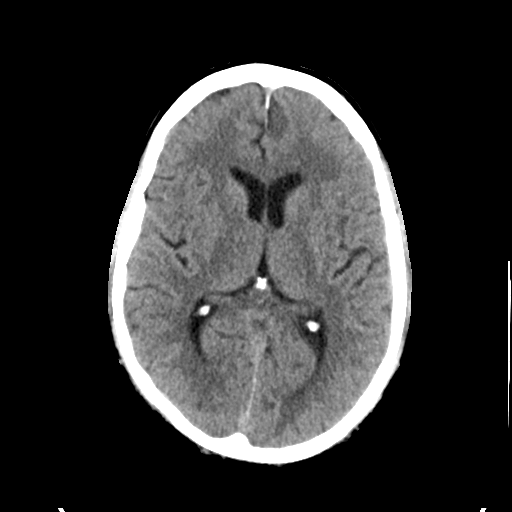
[im 17/31  brain]
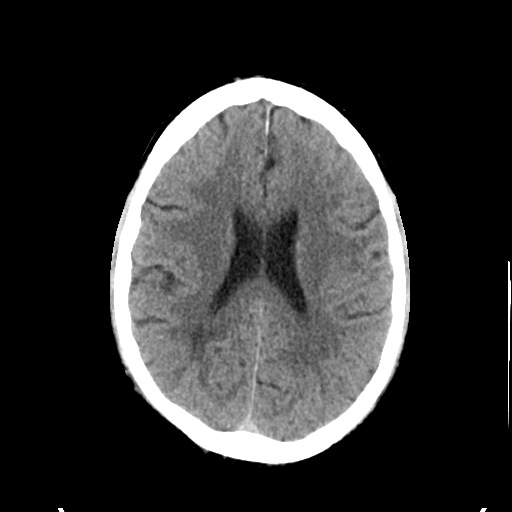
[im 17/31  bone]
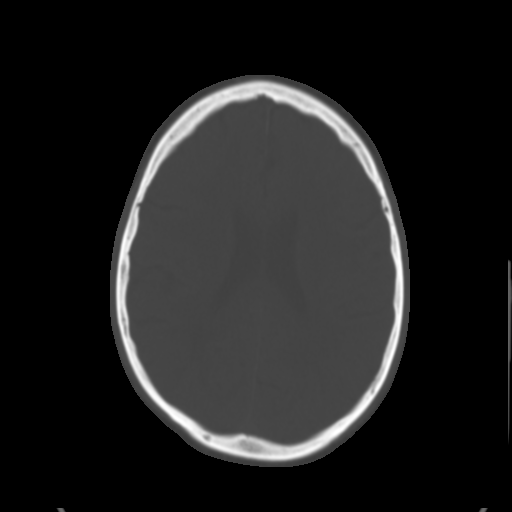
[im 21/31  brain]
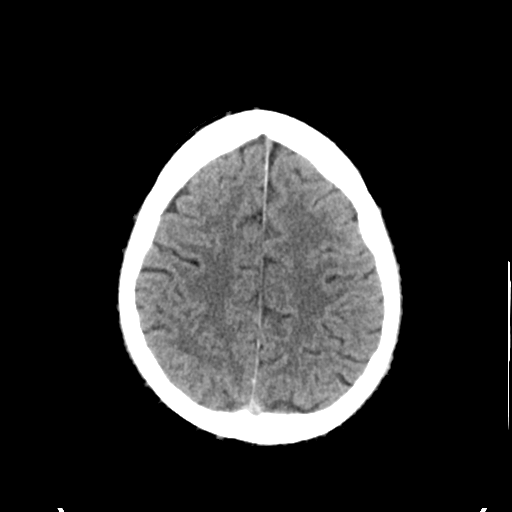
[im 24/31  brain]
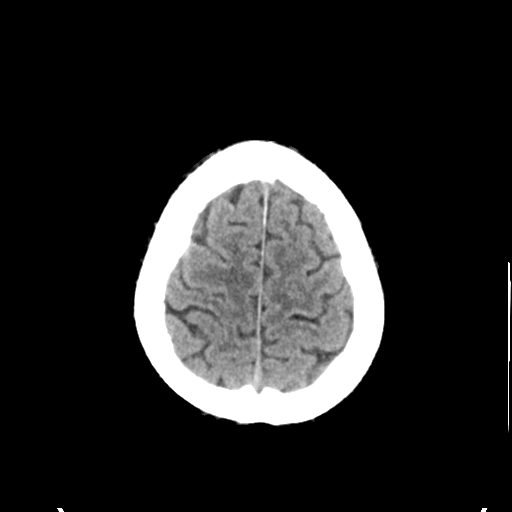
[im 28/31  brain]
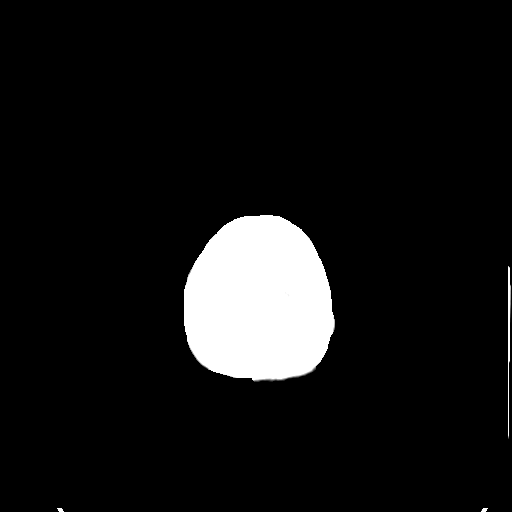

[Series 5: head 3.0 mpr cor · coronal · 0.31mm/px · 3 of 68 slices shown]
[im 23/68  brain]
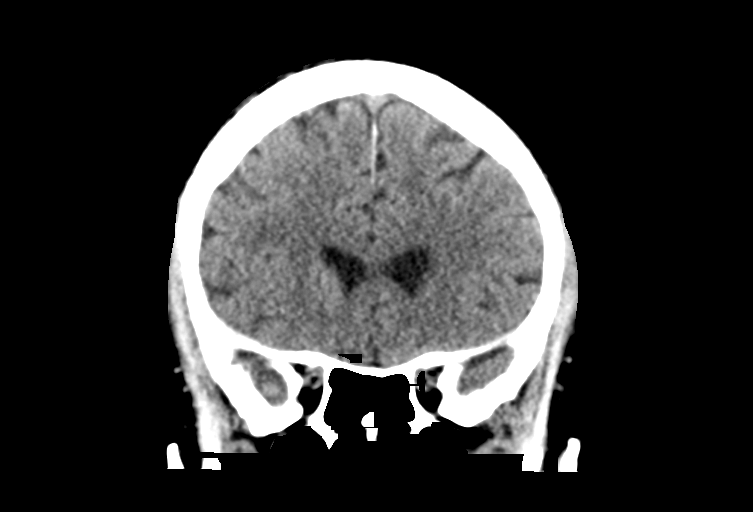
[im 30/68  brain]
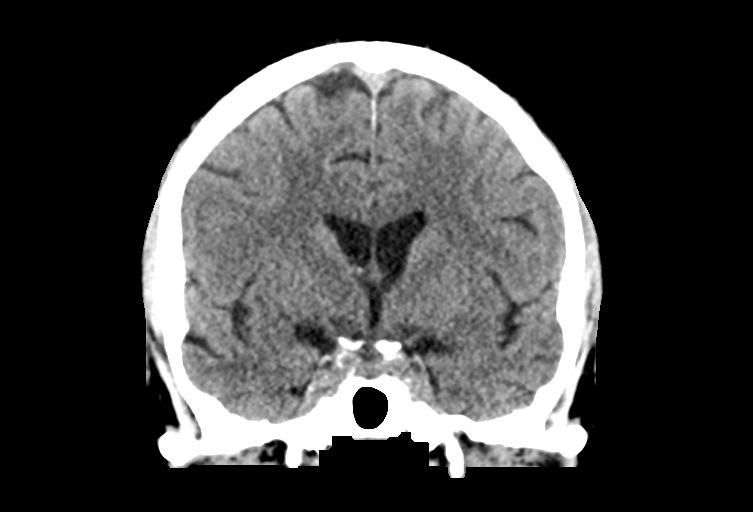
[im 38/68  brain]
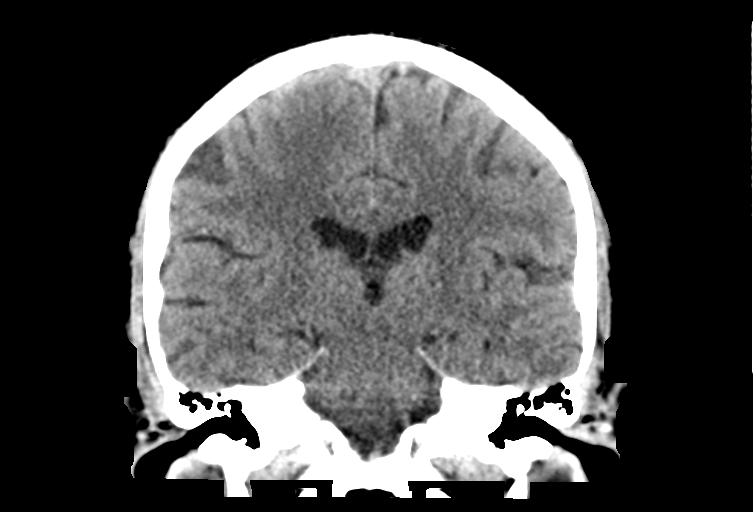

[Series 6: head 3.0 mpr sag · sagittal · 0.32mm/px · 3 of 67 slices shown]
[im 23/67  brain]
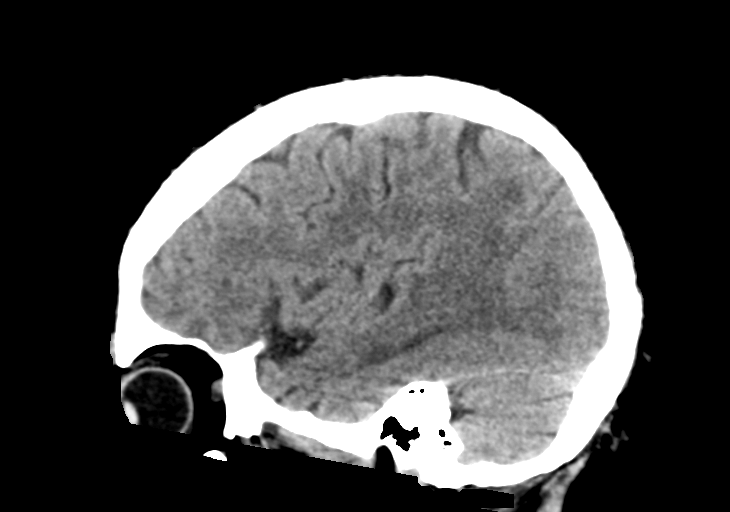
[im 34/67  brain]
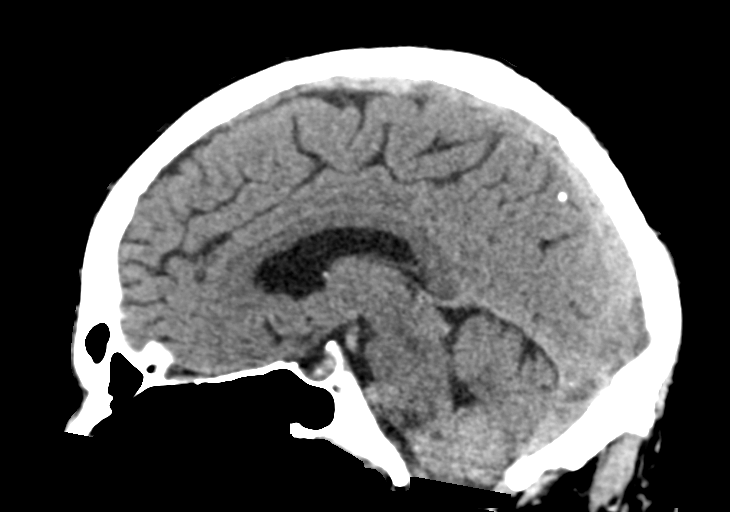
[im 45/67  brain]
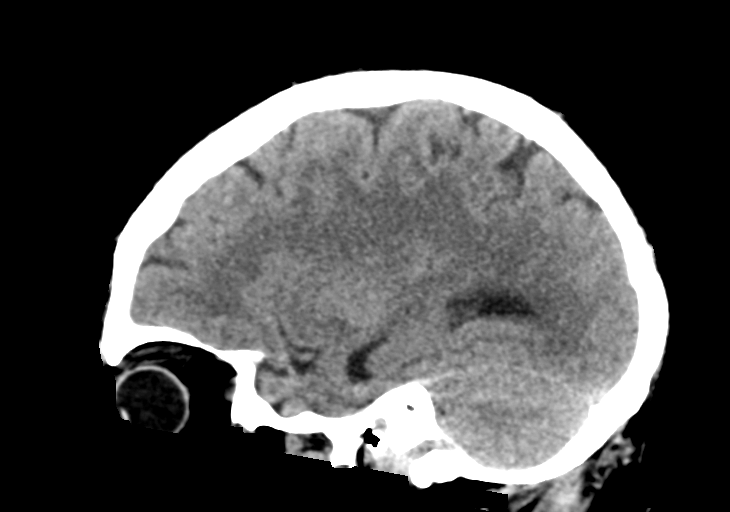

[14 of 47 positions shown; findings below may reference images not displayed]

FINDINGS: Brain: No intracranial hemorrhage or CT evidence of large acute
infarct.

No intracranial mass lesion noted on this unenhanced exam.

Vascular: Vascular calcifications

Skull: No acute abnormality.

Sinuses/Orbits: Post left lens replacement without acute orbital
abnormality. Opacification anterior right ethmoid sinus air cell.

Other: Post left stapedectomy. The prosthesis may extend slightly
into the vestibule. If further delineation is clinically desired,
petrous temporal bone CT may be considered.

Partial opacification posterior inferior left mastoid air cell.
IMPRESSION: Post left stapedectomy. The prosthesis may extend slightly into the
vestibule. If further delineation is clinically desired, petrous
temporal bone CT may be considered.

Opacification anterior right ethmoid sinus air cell and posterior
inferior left mastoid air cell.

Remainder of unenhanced head CT unremarkable.
